# Patient Record
Sex: Male | Born: 1970 | Race: White | Hispanic: No | Marital: Married | Smoking: Never smoker
Health system: Southern US, Community
[De-identification: ages and names within clinical notes are randomized; demographics above are authoritative.]

---

## 2012-12-16 ENCOUNTER — Ambulatory Visit: Payer: Self-pay | Admitting: Sports Medicine

## 2013-04-10 ENCOUNTER — Ambulatory Visit: Payer: Self-pay | Admitting: Family

## 2013-04-22 ENCOUNTER — Ambulatory Visit: Payer: Self-pay

## 2013-05-08 DIAGNOSIS — C779 Secondary and unspecified malignant neoplasm of lymph node, unspecified: Secondary | ICD-10-CM | POA: Insufficient documentation

## 2013-07-07 ENCOUNTER — Emergency Department: Payer: Self-pay

## 2013-07-07 LAB — URINALYSIS, COMPLETE
Bilirubin,UR: NEGATIVE
Blood: NEGATIVE
Glucose,UR: NEGATIVE mg/dL (ref 0–75)
Ketone: NEGATIVE
Leukocyte Esterase: NEGATIVE
Ph: 5 (ref 4.5–8.0)
Specific Gravity: 1.015 (ref 1.003–1.030)
Squamous Epithelial: NONE SEEN

## 2013-07-07 LAB — COMPREHENSIVE METABOLIC PANEL
Albumin: 3.3 g/dL — ABNORMAL LOW (ref 3.4–5.0)
Alkaline Phosphatase: 57 U/L (ref 50–136)
Anion Gap: 6 — ABNORMAL LOW (ref 7–16)
Bilirubin,Total: 0.7 mg/dL (ref 0.2–1.0)
Calcium, Total: 8.2 mg/dL — ABNORMAL LOW (ref 8.5–10.1)
Chloride: 97 mmol/L — ABNORMAL LOW (ref 98–107)
EGFR (African American): >60
Glucose: 108 mg/dL — ABNORMAL HIGH (ref 65–99)
Osmolality: 272 (ref 275–301)
Potassium: 3.3 mmol/L — ABNORMAL LOW (ref 3.5–5.1)
SGPT (ALT): 15 U/L (ref 12–78)
Sodium: 135 mmol/L — ABNORMAL LOW (ref 136–145)

## 2013-07-07 LAB — CBC WITH DIFFERENTIAL/PLATELET
Eosinophil #: 0 10*3/uL (ref 0.0–0.7)
Eosinophil %: 0 %
HGB: 12.6 g/dL — ABNORMAL LOW (ref 13.0–18.0)
Lymphocyte #: 1.1 10*3/uL (ref 1.0–3.6)
Neutrophil %: 96 %
RBC: 3.68 10*6/uL — ABNORMAL LOW (ref 4.40–5.90)
RDW: 15.7 % — ABNORMAL HIGH (ref 11.5–14.5)

## 2013-07-07 LAB — TROPONIN I: Troponin-I: <0.02 ng/mL

## 2013-07-12 LAB — CULTURE, BLOOD (SINGLE)

## 2015-03-21 IMAGING — CR DG CHEST 2V
1 series · 2 of 2 positions shown · non-contrast
Comparison: none

REASON FOR EXAM: syncope
COMMENTS:

PROCEDURE:     DXR - DXR CHEST PA (OR AP) AND LATERAL  - July 07, 2013  [DATE]
RESULT:     The lungs are clear. The cardiac silhouette and visualized bony
skeleton are unremarkable.

[Series 1: pa · 0.17mm/px · 2 of 2 slices shown]
[im 1/2]
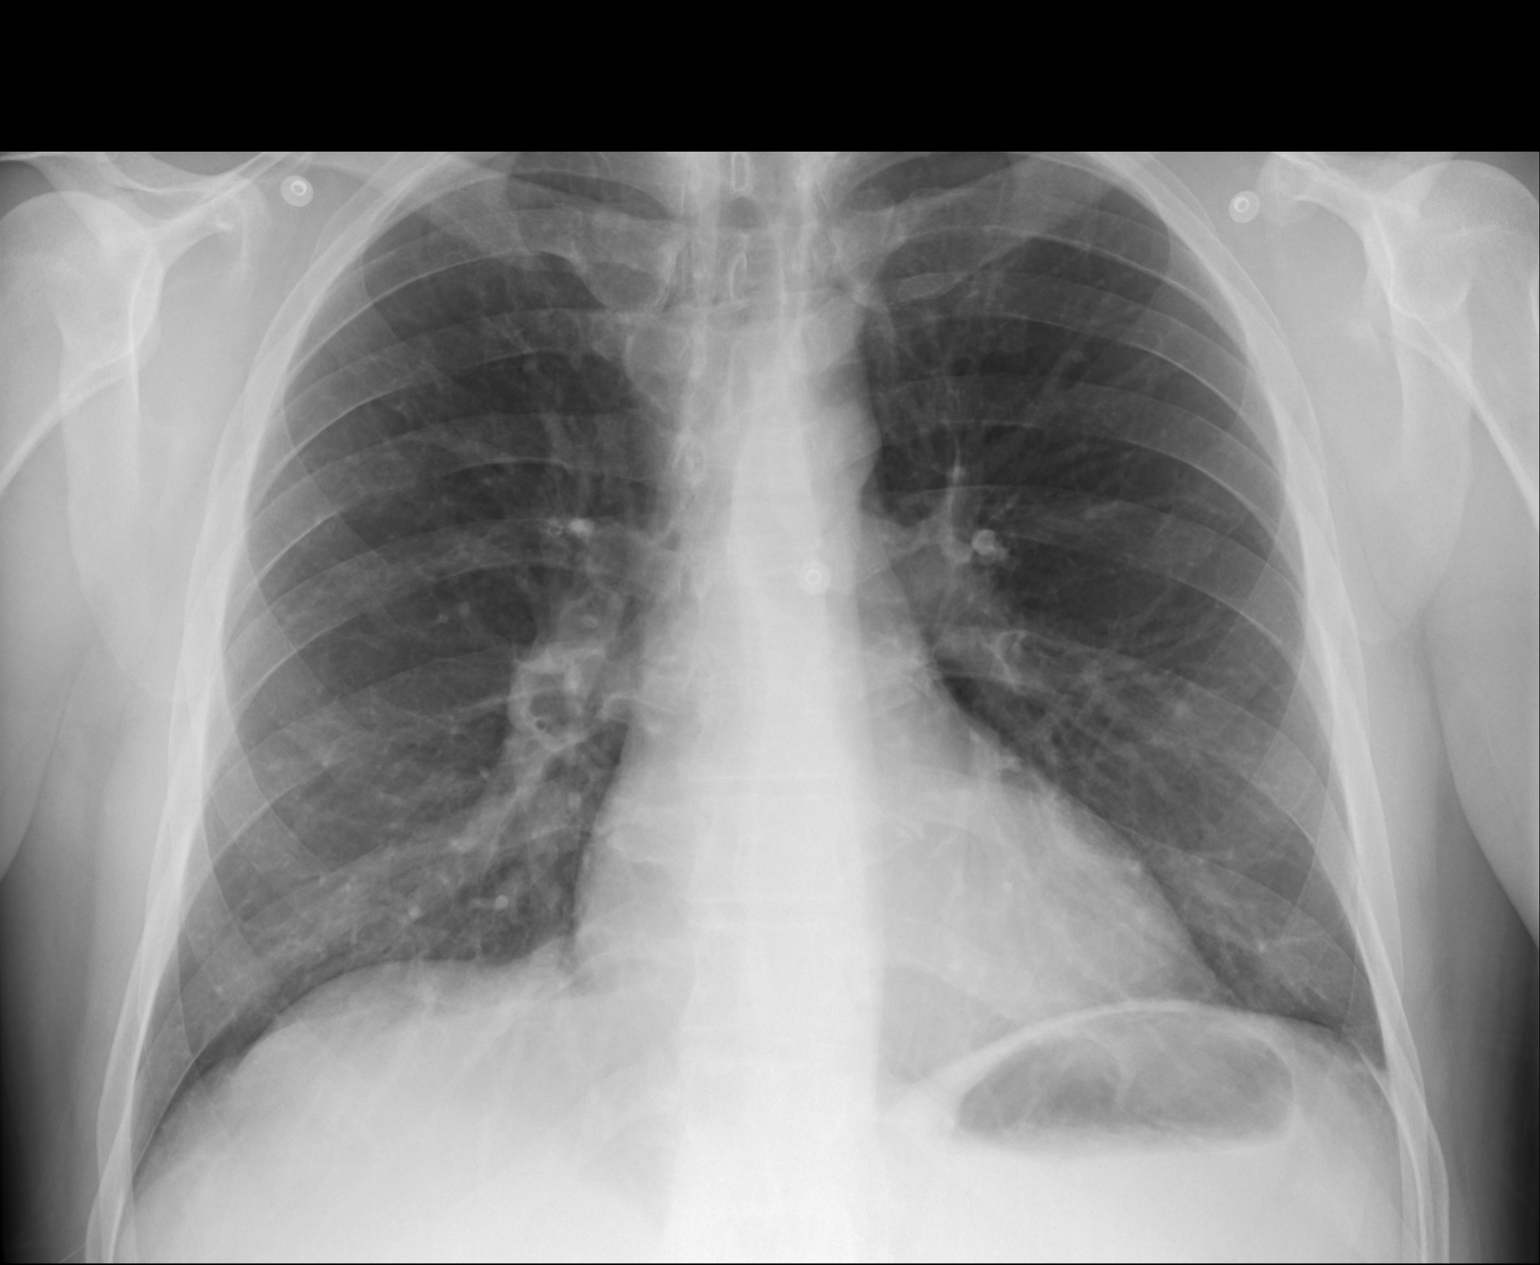
[im 2/2]
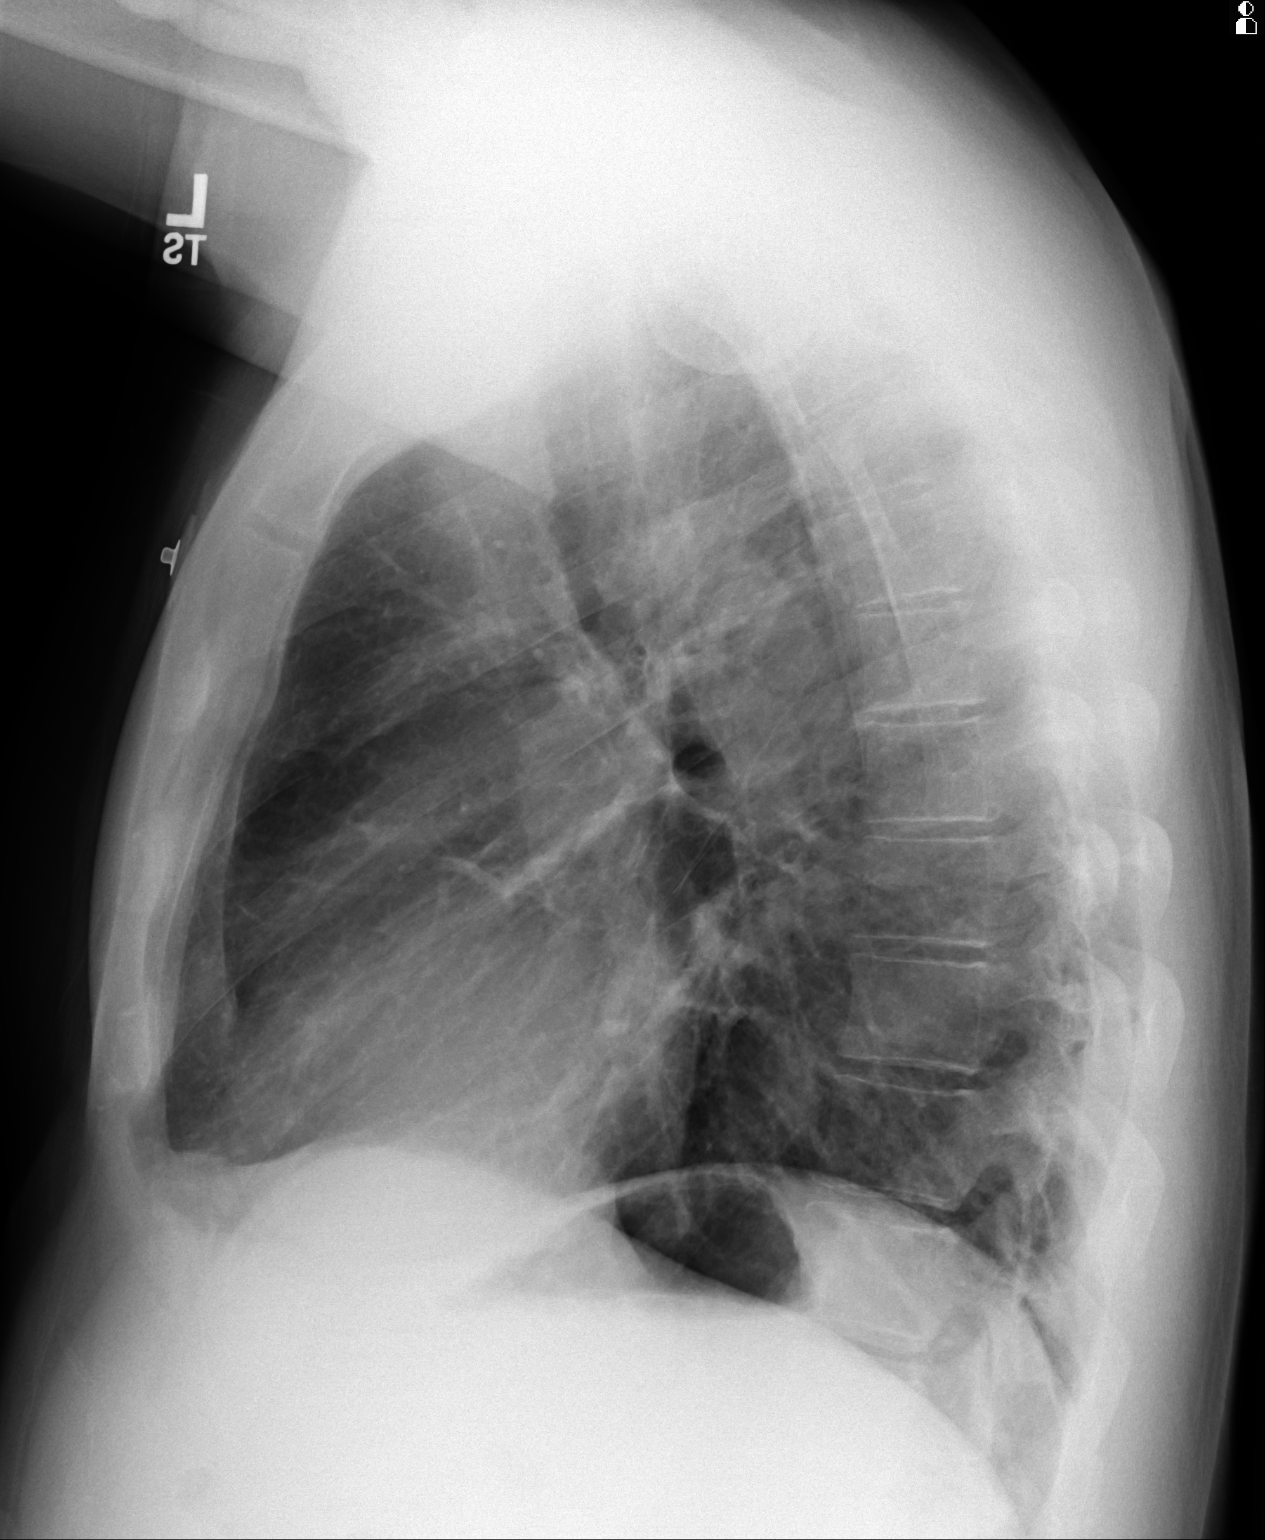

[2 of 2 positions shown; findings below may reference images not displayed]

IMPRESSION: 1. Chest radiograph without evidence of acute cardiopulmonary disease.

## 2018-06-11 ENCOUNTER — Ambulatory Visit: Payer: Self-pay

## 2019-06-12 ENCOUNTER — Other Ambulatory Visit: Payer: Self-pay

## 2019-06-12 ENCOUNTER — Ambulatory Visit: Payer: Self-pay

## 2019-06-12 DIAGNOSIS — Z23 Encounter for immunization: Secondary | ICD-10-CM

## 2020-05-28 ENCOUNTER — Other Ambulatory Visit: Payer: Self-pay

## 2020-05-28 ENCOUNTER — Ambulatory Visit: Payer: Self-pay | Admitting: Nurse Practitioner

## 2020-05-28 ENCOUNTER — Encounter: Payer: Self-pay | Admitting: Nurse Practitioner

## 2020-05-28 VITALS — BP 133/77 | HR 66 | Temp 97.0°F | Resp 16 | Ht 74.0 in | Wt 216.0 lb

## 2020-05-28 DIAGNOSIS — R1031 Right lower quadrant pain: Secondary | ICD-10-CM

## 2020-05-28 DIAGNOSIS — R1900 Intra-abdominal and pelvic swelling, mass and lump, unspecified site: Secondary | ICD-10-CM

## 2020-05-28 NOTE — Progress Notes (Signed)
   Subjective:    Patient ID: Phillip Jefferson, male    DOB: September 10, 1971, 49 y.o.   MRN: [id number]  HPI 49 year old male s/p left testicular CA, hx of orchiectomy and chemotherapy 2014. Treatment was at [facility], last f/u with oncology team was two years ago. Notes from outside providers are not available at this time to review but per CT imaging from 2014 it appears there may have been lymph node involvement. Patient is unsure. He is now followed by urology whom he visited most recently in April 2021. At that time some swelling was noted in his right pelvic region. This was thought to be adipose tissue by the urology team. Since that time the swelling has been intermittent and is associated with pain in the pelvic and right groin region without involvement of testicle.   Patient has two small children 4 & 6 and does lift them frequently, aside from that denies any known increased level of exercise or strain to region.   He received his COVID vaccines in March 2021  Denies any changes in bowel movements/habits.  Review of Systems  Constitutional: Negative.   Gastrointestinal: Negative for constipation.  Genitourinary: Negative for scrotal swelling and testicular pain.  Positive for Pelvic/ R lower abdominal swelling and pain     Objective:   Physical Exam Constitutional:      Appearance: Normal appearance.  Abdominal:     General: Bowel sounds are normal.     Palpations: Abdomen is soft.     Tenderness: There is abdominal tenderness in the right lower quadrant. There is no rebound.     Hernia: No hernia is present. There is no hernia in the right inguinal area.     Comments: Right lower Quad/ pelvic tenderness   Genitourinary:      Comments: Left testicle absent, right testicle appears normal.  Neurological:     Mental Status: He is alert.  Psychiatric:        Mood and Affect: Mood normal.           Assessment & Plan:  Will initiate referral back to [facility].   Patient will  call today to schedule follow up with [facility].   Discussed need for imaging to better diagnose inflamed region, patient is agreeable and understands the plan. Recommended avoiding heavy lifting for now.   Patient to follow up with fac/staff with any future needs.

## 2020-06-01 ENCOUNTER — Other Ambulatory Visit: Payer: Self-pay | Admitting: Nurse Practitioner

## 2020-06-10 NOTE — Addendum Note (Signed)
Addended by: Viviano Simas E on: 06/10/2020 01:56 PM   Modules accepted: Orders

## 2020-07-13 DIAGNOSIS — R1909 Other intra-abdominal and pelvic swelling, mass and lump: Secondary | ICD-10-CM | POA: Insufficient documentation

## 2021-06-01 ENCOUNTER — Ambulatory Visit: Payer: Self-pay

## 2021-06-08 ENCOUNTER — Ambulatory Visit: Payer: Self-pay

## 2021-06-08 ENCOUNTER — Other Ambulatory Visit: Payer: Self-pay

## 2021-06-08 DIAGNOSIS — Z23 Encounter for immunization: Secondary | ICD-10-CM

## 2021-11-17 ENCOUNTER — Ambulatory Visit: Payer: Self-pay | Admitting: Nurse Practitioner

## 2021-11-17 ENCOUNTER — Other Ambulatory Visit: Payer: Self-pay

## 2021-11-17 ENCOUNTER — Encounter: Payer: Self-pay | Admitting: Nurse Practitioner

## 2021-11-17 VITALS — BP 118/84 | HR 78 | Temp 97.8°F | Resp 16

## 2021-11-17 DIAGNOSIS — H6502 Acute serous otitis media, left ear: Secondary | ICD-10-CM

## 2021-11-17 MED ORDER — FLUTICASONE PROPIONATE 50 MCG/ACT NA SUSP: 2.0000 | Freq: Every day | NASAL | 6 refills | Status: DC

## 2021-11-17 NOTE — Progress Notes (Signed)
? ?Subjective:  ? ? Patient ID: Phillip Jefferson, male    DOB: 02-09-1971, 51 y.o.   MRN: [id number] ? ?HPI ? ?51 y/o male presents to the clinic with a 1 week history of left sided ear pressure/fullness. He denies ear pain, sore throat, sinus congestion, runny nose, PND, fever, chills, or body aches. He has not taken anything to try and relieve the ear pressure. States that he sometimes suffers from spring allergies but has not had any issues this spring.  ? ?Review of Systems  ?Constitutional: Negative.   ?HENT:  Negative for congestion, ear discharge, ear pain, postnasal drip, rhinorrhea, sinus pressure, sinus pain, sneezing and sore throat.   ?     Complains of left ear pressure  ?Eyes: Negative.   ?Respiratory: Negative.    ?Cardiovascular: Negative.   ?Gastrointestinal: Negative.   ?Endocrine: Negative.   ?Genitourinary: Negative.   ?Musculoskeletal: Negative.   ?Skin: Negative.   ?Allergic/Immunologic: Negative.   ?Neurological: Negative.   ?Hematological: Negative.   ?Psychiatric/Behavioral: Negative.    ? ?   ?Objective:  ? Physical Exam ?Vitals reviewed.  ?Constitutional:   ?   General: He is not in acute distress. ?   Appearance: Normal appearance. He is normal weight. He is not ill-appearing.  ?HENT:  ?   Head: Normocephalic and atraumatic.  ?   Right Ear: Tympanic membrane, ear canal and external ear normal.  ?   Left Ear: A middle ear effusion is present. Tympanic membrane is bulging.  ?   Ears:  ?   Comments: Left TM dull in appearance ?   Nose: Nose normal. No congestion or rhinorrhea.  ?   Mouth/Throat:  ?   Mouth: Mucous membranes are moist.  ?   Pharynx: Posterior oropharyngeal erythema present. No oropharyngeal exudate.  ?Eyes:  ?   Extraocular Movements: Extraocular movements intact.  ?   Conjunctiva/sclera: Conjunctivae normal.  ?   Pupils: Pupils are equal, round, and reactive to light.  ?Cardiovascular:  ?   Rate and Rhythm: Normal rate and regular rhythm.  ?   Heart sounds: Normal heart sounds. No  murmur heard. ?  No friction rub. No gallop.  ?Pulmonary:  ?   Effort: Pulmonary effort is normal.  ?   Breath sounds: Normal breath sounds.  ?Musculoskeletal:     ?   General: Normal range of motion.  ?   Cervical back: Normal range of motion and neck supple.  ?Skin: ?   General: Skin is warm and dry.  ?Neurological:  ?   General: No focal deficit present.  ?   Mental Status: He is alert and oriented to person, place, and time.  ?Psychiatric:     ?   Mood and Affect: Mood normal.     ?   Behavior: Behavior normal.     ?   Thought Content: Thought content normal.     ?   Judgment: Judgment normal.  ? ?Vitals:  ? 11/17/21 1428  ?BP: 118/84  ?Pulse: 78  ?Resp: 16  ?Temp: 97.8 ?F (36.6 ?C)  ?SpO2: 96%  ?  ? ? ? ? ?   ?Assessment & Plan:  ?1. Non-recurrent acute serous otitis media of left ear ?Discussed OTC use of antihistamines such as zyrtec or claritin. Encouraged to use flonase daily to decrease eustachian tube inflammation. Instructed to sleep with left ear facing up. May take ibuprofen for pain if needed. Prescription for flonase sent to pharmacy. RTC if symptoms worsen. ?   ? ?

## 2022-09-06 ENCOUNTER — Encounter: Payer: Self-pay | Admitting: Nurse Practitioner

## 2022-09-06 ENCOUNTER — Ambulatory Visit (INDEPENDENT_AMBULATORY_CARE_PROVIDER_SITE_OTHER): Payer: Self-pay | Admitting: Nurse Practitioner

## 2022-09-06 VITALS — BP 124/70 | HR 82 | Temp 98.1°F | Ht 74.0 in | Wt 209.8 lb

## 2022-09-06 DIAGNOSIS — R42 Dizziness and giddiness: Secondary | ICD-10-CM

## 2022-09-06 MED ORDER — MECLIZINE HCL 25 MG PO TABS: 25.0000 mg | ORAL_TABLET | Freq: Three times a day (TID) | ORAL | 0 refills | Status: DC | PRN

## 2022-09-06 NOTE — Patient Instructions (Signed)
Advised Naproxen twice daily  Sudafed 1-2 times a day for the next 2-3 days Flonase daily (two spray in each nostril) Sleep with left ear elevated  Warm compress to left ear  Follow up with ENT if symptoms persist

## 2022-09-06 NOTE — Progress Notes (Signed)
[facility] [address] [place], [place] [address] [phone]  Office Visit Note  Patient Name: Phillip Jefferson Date of Birth [id number]  Medical Record number [id number]  Date of Service: 09/06/2022  Chief Complaint  Patient presents with   ears    Vertigo 3 episode for last few weeks. Pressure in left ear.      HPI  Had COVID mid November and since that time he has had several episodes of vertigo since that time.   Went to clinic in [place] while to get examined and was told it was post viral   Most recent episode was three days ago.   During episode nausea, dizzy, denies heart palpitations, one episode of transient black out.   Did have one other post COVID episode over the summer of vertigo just after COVID.   Episodes occur when moving.  Has not happened while sitting or laying down  Has not happened in the middle of the night   Has been feeling pressure in left ear  He was given an oral steroid while abroad and was given Flonase as well that he has since stopped.   Pressure in ear returned Monday - that was the day of most recent episode of vertigo    COVID symptoms included sinus congestion no treatment/secondary infection One night of chills/fever    Current Medication:  Outpatient Encounter Medications as of 09/06/2022  Medication Sig   fluticasone (FLONASE) 50 MCG/ACT nasal spray Place 2 sprays into both nostrils daily.   Multiple Vitamin (DAILY VITAMINS) tablet Take 1 tablet by mouth daily. (Patient not taking: Reported on 09/06/2022)   No facility-administered encounter medications on file as of 09/06/2022.      Medical History: No past medical history on file.   Vital Signs: BP 124/70 (BP Location: Left Arm, Patient Position: Sitting, Cuff Size: Normal)   Pulse 82   Temp 98.1 F (36.7 C) (Tympanic)   Ht 6\' 2"  (1.88 m)   Wt 209 lb 12.8 oz (95.2 kg)   SpO2 98%   BMI 26.94 kg/m   Orthostatics Sitting 126/78    pulse 81 Standing  118/76   pulse 85  Review of Systems  Constitutional: Negative.   HENT:  Positive for congestion. Negative for ear pain and hearing loss.   Respiratory: Negative.    Cardiovascular: Negative.   Musculoskeletal: Negative.   Neurological:  Positive for dizziness. Negative for syncope.      Physical Exam HENT:     Head: Normocephalic.     Right Ear: Hearing, tympanic membrane, ear canal and external ear normal.     Left Ear: A middle ear effusion is present. Tympanic membrane is not injected, erythematous or bulging.     Nose: Nose normal.  Pulmonary:     Effort: Pulmonary effort is normal.  Neurological:     General: No focal deficit present.     Mental Status: He is alert and oriented to person, place, and time. Mental status is at baseline.  Psychiatric:        Mood and Affect: Mood normal.       Assessment/Plan: 1. Vertigo  - Ambulatory referral to ENT    Advised Naproxen twice daily  Sudafed 1-2 times a day for the next 2-3 days Flonase daily (two spray in each nostril) Sleep with left ear elevated  Warm compress to left ear  Follow up with ENT if symptoms persist     General Counseling: understanding of the  findings of todays visit and agrees with plan of treatment. I have discussed any further diagnostic evaluation that may be needed or ordered today. We also reviewed his medications today. he has been encouraged to call the office with any questions or concerns that should arise related to todays visit.    Time spent:20 Minutes   Viviano Simas [facility] Nurse Practitioner

## 2022-10-13 ENCOUNTER — Ambulatory Visit (INDEPENDENT_AMBULATORY_CARE_PROVIDER_SITE_OTHER): Payer: Self-pay | Admitting: Adult Health

## 2022-10-13 ENCOUNTER — Encounter: Payer: Self-pay | Admitting: Adult Health

## 2022-10-13 VITALS — BP 118/80 | HR 73 | Temp 98.2°F | Wt 211.8 lb

## 2022-10-13 DIAGNOSIS — Z23 Encounter for immunization: Secondary | ICD-10-CM

## 2022-10-13 DIAGNOSIS — R002 Palpitations: Secondary | ICD-10-CM

## 2022-10-13 NOTE — Progress Notes (Signed)
[facility] [address], [place] [address]   Office Visit Note  Patient Name: Phillip Jefferson Date of Birth [id number]  Medical Record number [id number]  Date of Service: 10/13/2022  Chief Complaint  Patient presents with   Palpitations    Doesn't feel bad, working out no issues. Started over the weekend.      HPI Pt is here for a sick visit.  Patient reports for the last few days he has noticed some different beats of his heart.  He feels like its skipping a beat at times. It has been going on for about a week.  These episodes generally last a few minutes.  His exercise has increased in the last week, he does light weights and rides a Peloton.  He does not have any issues while exercising. Denies sob, dizziness.  Caffeine: one cup of coffee a day.  No other. Regular diet:  cook and eat at home, no fried foods unless the occasional, out to eat. Sleep: 6 hours a night 9 years ago he had Testicular cancer, Radiation and surgery. Heart Murmor was noticed on last physical.  PCP did echo.    Current Medication:  Outpatient Encounter Medications as of 10/13/2022  Medication Sig   fluticasone (FLONASE) 50 MCG/ACT nasal spray Place 2 sprays into both nostrils daily.   meclizine (ANTIVERT) 25 MG tablet Take 1 tablet (25 mg total) by mouth 3 (three) times daily as needed for dizziness. (Patient not taking: Reported on 10/13/2022)   Multiple Vitamin (DAILY VITAMINS) tablet Take 1 tablet by mouth daily. (Patient not taking: Reported on 09/06/2022)   No facility-administered encounter medications on file as of 10/13/2022.      Medical History: No past medical history on file.   Vital Signs: BP 118/80 (BP Location: Left Arm, Patient Position: Sitting, Cuff Size: Normal)   Pulse 73   Temp 98.2 F (36.8 C) (Tympanic)   Wt 211 lb 12.8 oz (96.1 kg)   SpO2 99%   BMI 27.19 kg/m    Review of Systems  Constitutional:  Negative for chills, fatigue and fever.  HENT:  Negative for  ear pain, sinus pressure and sore throat.   Eyes:  Negative for photophobia, pain and itching.  Respiratory:  Negative for cough, chest tightness and shortness of breath.   Cardiovascular:  Positive for palpitations. Negative for leg swelling.  Gastrointestinal:  Negative for diarrhea, nausea and vomiting.  Musculoskeletal:  Negative for back pain.  Neurological:  Negative for dizziness, tremors, syncope, speech difficulty, weakness, light-headedness, numbness and headaches.    Physical Exam Vitals and nursing note reviewed.  Constitutional:      Appearance: Normal appearance.  HENT:     Head: Normocephalic.     Right Ear: Tympanic membrane and ear canal normal.     Left Ear: Tympanic membrane and ear canal normal.     Nose: Nose normal.     Mouth/Throat:     Mouth: Mucous membranes are moist.  Eyes:     Pupils: Pupils are equal, round, and reactive to light.  Cardiovascular:     Rate and Rhythm: Normal rate and regular rhythm.  Pulmonary:     Effort: Pulmonary effort is normal.     Breath sounds: Normal breath sounds.  Lymphadenopathy:     Cervical: No cervical adenopathy.  Skin:    General: Skin is warm and dry.     Capillary Refill: Capillary refill takes less than 2 seconds.  Neurological:  General: No focal deficit present.     Mental Status: He is alert and oriented to person, place, and time.     Motor: No weakness.  Psychiatric:        Mood and Affect: Mood normal.        Behavior: Behavior normal.        Thought Content: Thought content normal.    Assessment/Plan: 1. Palpitations EKG compared to previous in 2014.  No significant changes.  Low voltage noted.  NSR Rate 66.  Labs ordered.  Results available in Mychart.  - TSH - CBC with Differential/Platelet - Comprehensive metabolic panel - EKG 96-EXBM  2. Flu vaccine need - Flu Vaccine QUAD 6+ mos PF IM (Fluarix Quad PF)     General Counseling: Deloy verbalizes understanding of the findings of todays  visit and agrees with plan of treatment. I have discussed any further diagnostic evaluation that may be needed or ordered today. We also reviewed his medications today. he has been encouraged to call the office with any questions or concerns that should arise related to todays visit.   Orders Placed This Encounter  Procedures   Flu Vaccine QUAD 6+ mos PF IM (Fluarix Quad PF)   TSH   CBC with Differential/Platelet   Comprehensive metabolic panel   EKG 84-XLKG    No orders of the defined types were placed in this encounter.   Time spent:25 Minutes    Kendell Bane AGNP-C Nurse Practitioner

## 2022-10-15 LAB — COMPREHENSIVE METABOLIC PANEL
ALT: 13 IU/L (ref 0–44)
AST: 16 IU/L (ref 0–40)
Albumin/Globulin Ratio: 2.4 — ABNORMAL HIGH (ref 1.2–2.2)
Albumin: 4.6 g/dL (ref 3.8–4.9)
Alkaline Phosphatase: 55 IU/L (ref 44–121)
BUN/Creatinine Ratio: 17 (ref 9–20)
BUN: 14 mg/dL (ref 6–24)
Bilirubin Total: 0.5 mg/dL (ref 0.0–1.2)
CO2: 26 mmol/L (ref 20–29)
Calcium: 9.3 mg/dL (ref 8.7–10.2)
Chloride: 103 mmol/L (ref 96–106)
Creatinine, Ser: 0.84 mg/dL (ref 0.76–1.27)
Globulin, Total: 1.9 g/dL (ref 1.5–4.5)
Glucose: 94 mg/dL (ref 70–99)
Potassium: 4.3 mmol/L (ref 3.5–5.2)
Sodium: 141 mmol/L (ref 134–144)
Total Protein: 6.5 g/dL (ref 6.0–8.5)
eGFR: 106 mL/min/{1.73_m2} (ref >59)

## 2022-10-15 LAB — CBC WITH DIFFERENTIAL/PLATELET
Basophils Absolute: 0.1 10*3/uL (ref 0.0–0.2)
Basos: 1 %
EOS (ABSOLUTE): 0.1 10*3/uL (ref 0.0–0.4)
Eos: 2 %
Hematocrit: 44.4 % (ref 37.5–51.0)
Hemoglobin: 15.8 g/dL (ref 13.0–17.7)
Immature Grans (Abs): 0 10*3/uL (ref 0.0–0.1)
Immature Granulocytes: 0 %
Lymphocytes Absolute: 2 10*3/uL (ref 0.7–3.1)
Lymphs: 35 %
MCH: 34 pg — ABNORMAL HIGH (ref 26.6–33.0)
MCHC: 35.6 g/dL (ref 31.5–35.7)
MCV: 96 fL (ref 79–97)
Monocytes Absolute: 0.6 10*3/uL (ref 0.1–0.9)
Monocytes: 10 %
Neutrophils Absolute: 2.9 10*3/uL (ref 1.4–7.0)
Neutrophils: 52 %
Platelets: 221 10*3/uL (ref 150–450)
RBC: 4.65 x10E6/uL (ref 4.14–5.80)
RDW: 12.8 % (ref 11.6–15.4)
WBC: 5.6 10*3/uL (ref 3.4–10.8)

## 2022-10-15 LAB — TSH: TSH: 1.09 u[IU]/mL (ref 0.450–4.500)

## 2022-10-16 ENCOUNTER — Encounter: Payer: Self-pay | Admitting: Adult Health

## 2022-11-07 ENCOUNTER — Encounter: Payer: Self-pay | Admitting: Physician Assistant

## 2022-11-07 ENCOUNTER — Ambulatory Visit (INDEPENDENT_AMBULATORY_CARE_PROVIDER_SITE_OTHER): Payer: Self-pay | Admitting: Physician Assistant

## 2022-11-07 VITALS — BP 116/70 | HR 71 | Temp 98.3°F

## 2022-11-07 DIAGNOSIS — R42 Dizziness and giddiness: Secondary | ICD-10-CM

## 2022-11-07 MED ORDER — SCOPOLAMINE 1 MG/3DAYS TD PT72: 1.0000 | MEDICATED_PATCH | TRANSDERMAL | 12 refills | Status: DC

## 2022-11-07 NOTE — Progress Notes (Signed)
[facility] [address], [place] [address]   Office Visit Note  Patient Name: Phillip Jefferson Date of Birth [id number]  Medical Record number [id number]  Date of Service: 11/07/2022  Chief Complaint  Patient presents with   Dizziness    Was suppose to leave for [place] Today. Needs a Drs note. Dizziness started in Nov. When he was in [place]. Had another episode in Dec. And seen ENT 2 weeks ago. Had 3 more episode last week. Scheduled in March for more testing at ENT. No episodes since Sun.      52 y/o M presents to the clinic for episodes of dizziness lasting few seconds to few minutes. 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 He was told to take decongestants and had for quite some time. Since then he's had multiple episodes of dizziness sometimes with change of position and other times with sitting in a chair. He has been to ENT and had a work-up. He is scheduled to go to ENT in couple of weeks for additional testing. He currently denies feeling dizzy. Last episode two days ago. Patient is scheduled to fly internationally and because he is still not 100% from his ongoing dizzy spells. Denies nausea or vomiting. Pt denies h/o open water activities. No recent dental procedures. No head or neck trauma. No changes in medications or taking any new vitamins or supplements.   Dizziness Pertinent negatives include no headaches or weakness.      Current Medication:  Outpatient Encounter Medications as of 11/07/2022  Medication Sig   loratadine (CLARITIN) 10 MG tablet Take 10 mg by mouth daily.   meclizine (ANTIVERT) 25 MG tablet Take 1 tablet (25 mg total) by mouth 3 (three) times daily as needed for dizziness.   scopolamine (TRANSDERM-SCOP) 1 MG/3DAYS Place 1 patch (1.5 mg total) onto the skin every 3 (three) days.   fluticasone (FLONASE) 50 MCG/ACT nasal spray Place 2 sprays into both nostrils daily.   Multiple Vitamin (DAILY VITAMINS)  tablet Take 1 tablet by mouth daily. (Patient not taking: Reported on 09/06/2022)   No facility-administered encounter medications on file as of 11/07/2022.      Medical History: No past medical history on file.   Vital Signs: BP 116/70 (BP Location: Left Arm, Patient Position: Sitting, Cuff Size: Normal)   Pulse 71   Temp 98.3 F (36.8 C) (Tympanic)   SpO2 99%    Review of Systems  HENT: Negative.    Respiratory: Negative.    Cardiovascular: Negative.   Gastrointestinal: Negative.   Neurological:  Positive for dizziness. Negative for facial asymmetry, weakness, light-headedness and headaches.    Physical Exam Constitutional:      Appearance: Normal appearance.  HENT:     Head: Atraumatic.     Right Ear: Ear canal and external ear normal.     Left Ear: Tympanic membrane, ear canal and external ear normal.     Ears:     Comments: Partial occluded with cerumen, able to see partial right TM and appears normal.  L TM appears normal with slightly dull appearing.      Nose: Nose normal.     Mouth/Throat:     Mouth: Mucous membranes are moist.     Pharynx: Oropharynx is clear.  Eyes:     Extraocular Movements: Extraocular movements intact.  Cardiovascular:     Rate and Rhythm: Normal rate and regular rhythm.     Heart sounds: Murmur (faint)  heard.  Pulmonary:     Effort: Pulmonary effort is normal.     Breath sounds: Normal breath sounds.  Musculoskeletal:     Cervical back: Neck supple.  Skin:    General: Skin is warm.  Neurological:     General: No focal deficit present.     Mental Status: He is alert and oriented to person, place, and time.     Gait: Gait normal.     Comments: Romberg test- equivocal with slight unsteady within 1-2 seconds.   Psychiatric:        Mood and Affect: Mood normal.        Behavior: Behavior normal.        Thought Content: Thought content normal.        Judgment: Judgment normal.       Assessment/Plan:  1. Dizziness -  scopolamine (TRANSDERM-SCOP) 1 MG/3DAYS; Place 1 patch (1.5 mg total) onto the skin every 3 (three) days.  Dispense: 10 patch; Refill: 12  Follow up with ENT as scheduled for further work-up. Briefly discussed referral to Neuro for further imaging if workup by ENT is negative. Discussed physical therapy. Will send a Rx for Scopolamine patch to help for dizziness.  Consider re-starting Sudafed, since he felt better while on it. May consider re-starting Meclizine as needed.  He requested an excuse to not travel due to his condition and I agree given his recent multiple episodes of dizziness. A note was provided to him regarding no air travel.  Pt verbalized understanding and in agreement.    General Counseling: verne chaussee understanding of the findings of todays visit and agrees with plan of treatment. I have discussed any further diagnostic evaluation that may be needed or ordered today. We also reviewed his medications today. he has been encouraged to call the office with any questions or concerns that should arise related to todays visit.    Time spent:30 [place], [place] Physician Assistant

## 2024-01-28 ENCOUNTER — Other Ambulatory Visit: Payer: Self-pay

## 2024-01-28 ENCOUNTER — Ambulatory Visit (INDEPENDENT_AMBULATORY_CARE_PROVIDER_SITE_OTHER): Payer: Self-pay | Admitting: Oncology

## 2024-01-28 ENCOUNTER — Encounter: Payer: Self-pay | Admitting: Oncology

## 2024-01-28 VITALS — BP 129/74 | HR 77 | Temp 98.1°F | Ht 74.0 in | Wt 215.0 lb

## 2024-01-28 DIAGNOSIS — Z029 Encounter for administrative examinations, unspecified: Secondary | ICD-10-CM

## 2024-01-28 NOTE — Progress Notes (Signed)
 [facility]  [address] [place], [place] [address] Phone: [phone] Fax: [phone]   Office Visit Note  Patient Name: Phillip Jefferson  Date of [id number]  Med Rec number [id number]  Date of Service: 01/28/2024  Patient has no known allergies.  Chief Complaint  Patient presents with   Employment Physical    Needs form completed for employment with [employer]    HPI Patient is an 53 y.o. male here to have paperwork filled out for [employer] as he was recently elected for receipt on the board.  Reports he had a COVID booster back in August/September 2024.  His last Tdap was in 2024 as well.  He is unsure of TB, MMR and hep B status.  He was immunized as a child.   No current concerns.  No cough, unintentional weight loss or recent international travel.  Current Medication:  Outpatient Encounter Medications as of 01/28/2024  Medication Sig   loratadine (CLARITIN) 10 MG tablet Take 10 mg by mouth daily as needed.   [DISCONTINUED] fluticasone  (FLONASE ) 50 MCG/ACT nasal spray Place 2 sprays into both nostrils daily.   [DISCONTINUED] meclizine  (ANTIVERT ) 25 MG tablet Take 1 tablet (25 mg total) by mouth 3 (three) times daily as needed for dizziness.   [DISCONTINUED] Multiple Vitamin (DAILY VITAMINS) tablet Take 1 tablet by mouth daily. (Patient not taking: Reported on 09/06/2022)   [DISCONTINUED] scopolamine  (TRANSDERM-SCOP) 1 MG/3DAYS Place 1 patch (1.5 mg total) onto the skin every 3 (three) days.   No facility-administered encounter medications on file as of 01/28/2024.      Medical History: History reviewed. No pertinent past medical history.   Vital Signs: BP 129/74   Pulse 77   Temp 98.1 F (36.7 C)   Ht 6\' 2"  (1.88 m)   Wt 215 lb (97.5 kg)   SpO2 98%   BMI 27.60 kg/m   ROS: As per HPI.  All other pertinent ROS negative.     Review of Systems  Constitutional:  Negative for appetite change, fatigue, fever and unexpected weight  change.  Respiratory:  Negative for cough and shortness of breath.   Gastrointestinal:  Negative for constipation, diarrhea and nausea.  Neurological:  Negative for dizziness.  Psychiatric/Behavioral:  The patient is not nervous/anxious.     Physical Exam Constitutional:      Appearance: Normal appearance.  Cardiovascular:     Rate and Rhythm: Normal rate and regular rhythm.  Pulmonary:     Effort: Pulmonary effort is normal.     Breath sounds: Normal breath sounds.  Abdominal:     General: Bowel sounds are normal.     Palpations: Abdomen is soft.  Musculoskeletal:        General: No swelling. Normal range of motion.  Neurological:     Mental Status: He is alert and oriented to person, place, and time. Mental status is at baseline.     No results found for this or any previous visit (from the past 24 hours).  Assessment/Plan: 1. Administrative encounter (Primary) Needs record confirming immunization status for MMR and hepatitis B for [employer]. Recently had COVID vaccine booster last year -Aug 2024 Required TB test.  Will get QuantiFERON TB Gold and send results via MyChart when available. Discussed paperwork.  Will fill out when lab work returns.  - QuantiFERON-TB Gold Plus - Measles/Mumps/Rubella Immunity - Hepatitis B surface antibody,quantitative    General Counseling: Avery verbalizes understanding of the findings of todays visit  and agrees with plan of treatment. I have discussed any further diagnostic evaluation that may be needed or ordered today. We also reviewed his medications today. he has been encouraged to call the office with any questions or concerns that should arise related to todays visit.   Orders Placed This Encounter  Procedures   QuantiFERON-TB Gold Plus   Measles/Mumps/Rubella Immunity   Hepatitis B surface antibody,quantitative    No orders of the defined types were placed in this encounter.   I spent 20 minutes dedicated to the  care of this patient (face-to-face and non-face-to-face) on the date of the encounter to include what is described in the assessment and plan.   Charlton Cooler, NP 01/28/2024 9:10 AM

## 2024-01-30 ENCOUNTER — Ambulatory Visit: Payer: Self-pay | Admitting: Oncology

## 2024-01-31 ENCOUNTER — Ambulatory Visit (INDEPENDENT_AMBULATORY_CARE_PROVIDER_SITE_OTHER): Payer: Self-pay

## 2024-01-31 DIAGNOSIS — Z23 Encounter for immunization: Secondary | ICD-10-CM

## 2024-01-31 LAB — MEASLES/MUMPS/RUBELLA IMMUNITY
MUMPS ABS, IGG: 21.2 [AU]/ml (ref >10.9)
RUBEOLA AB, IGG: 75 [AU]/ml (ref >16.4)
Rubella Antibodies, IGG: 2.67 {index} (ref >0.99)

## 2024-01-31 LAB — QUANTIFERON-TB GOLD PLUS
QuantiFERON Mitogen Value: >10 [IU]/mL
QuantiFERON Nil Value: 0.12 [IU]/mL
QuantiFERON TB1 Ag Value: 0.1 [IU]/mL
QuantiFERON TB2 Ag Value: 0.1 [IU]/mL
QuantiFERON-TB Gold Plus: NEGATIVE

## 2024-01-31 LAB — HEPATITIS B SURFACE ANTIBODY, QUANTITATIVE: Hepatitis B Surf Ab Quant: <3.5 m[IU]/mL — ABNORMAL LOW

## 2024-01-31 NOTE — Progress Notes (Signed)
 Thanks so much.   Charlton Cooler, NP 01/31/2024 11:22 AM

## 2024-01-31 NOTE — Progress Notes (Signed)
 Left message for patient to call back. Paperwork completed and ready to be picked up.
